# Patient Record
Sex: Female | Born: 1935
Health system: Southern US, Community
[De-identification: ages and names within clinical notes are randomized; demographics above are authoritative.]

---

## 2005-01-21 ENCOUNTER — Ambulatory Visit: Payer: Self-pay | Admitting: Family Medicine

## 2005-03-24 ENCOUNTER — Ambulatory Visit: Payer: Self-pay | Admitting: Family Medicine

## 2005-03-29 ENCOUNTER — Emergency Department: Payer: Self-pay | Admitting: Emergency Medicine

## 2005-07-19 ENCOUNTER — Ambulatory Visit: Payer: Self-pay | Admitting: Specialist

## 2005-07-28 ENCOUNTER — Ambulatory Visit: Payer: Self-pay | Admitting: Ophthalmology

## 2005-08-03 ENCOUNTER — Ambulatory Visit: Payer: Self-pay | Admitting: Ophthalmology

## 2007-09-08 ENCOUNTER — Ambulatory Visit: Payer: Self-pay | Admitting: Cardiology

## 2007-10-27 ENCOUNTER — Ambulatory Visit: Payer: Self-pay | Admitting: Family Medicine

## 2008-01-10 ENCOUNTER — Ambulatory Visit: Payer: Self-pay | Admitting: Ophthalmology

## 2008-01-17 ENCOUNTER — Ambulatory Visit: Payer: Self-pay | Admitting: Ophthalmology

## 2008-07-02 ENCOUNTER — Ambulatory Visit: Payer: Self-pay | Admitting: Gastroenterology

## 2008-07-17 ENCOUNTER — Ambulatory Visit: Payer: Self-pay | Admitting: *Deleted

## 2008-11-08 ENCOUNTER — Ambulatory Visit: Payer: Self-pay | Admitting: General Surgery

## 2008-11-15 ENCOUNTER — Ambulatory Visit: Payer: Self-pay | Admitting: General Surgery

## 2010-12-11 ENCOUNTER — Ambulatory Visit: Payer: Self-pay | Admitting: Specialist

## 2011-01-13 ENCOUNTER — Ambulatory Visit: Payer: Self-pay | Admitting: Pain Medicine

## 2011-06-15 ENCOUNTER — Ambulatory Visit: Payer: Self-pay | Admitting: Pain Medicine

## 2011-06-17 ENCOUNTER — Ambulatory Visit: Payer: Self-pay | Admitting: Pain Medicine

## 2011-07-07 ENCOUNTER — Ambulatory Visit: Payer: Self-pay | Admitting: Pain Medicine

## 2011-07-15 ENCOUNTER — Ambulatory Visit: Payer: Self-pay | Admitting: Pain Medicine

## 2013-07-02 ENCOUNTER — Ambulatory Visit: Payer: Self-pay | Admitting: Family Medicine

## 2013-08-21 ENCOUNTER — Emergency Department: Payer: Self-pay | Admitting: Emergency Medicine

## 2013-08-21 LAB — CBC WITH DIFFERENTIAL/PLATELET
Basophil #: 0 10*3/uL (ref 0.0–0.1)
Basophil %: 0.9 %
Eosinophil #: 0 10*3/uL (ref 0.0–0.7)
Eosinophil %: 0.9 %
HGB: 12.7 g/dL (ref 12.0–16.0)
Lymphocyte #: 1.7 10*3/uL (ref 1.0–3.6)
Lymphocyte %: 33.3 %
MCH: 33.7 pg (ref 26.0–34.0)
Monocyte #: 0.6 x10 3/mm (ref 0.2–0.9)
Monocyte %: 10.8 %
Neutrophil #: 2.8 10*3/uL (ref 1.4–6.5)
Neutrophil %: 54.1 %
RBC: 3.78 10*6/uL — ABNORMAL LOW (ref 3.80–5.20)
WBC: 5.2 10*3/uL (ref 3.6–11.0)

## 2013-08-21 LAB — COMPREHENSIVE METABOLIC PANEL
Albumin: 4.3 g/dL (ref 3.4–5.0)
Alkaline Phosphatase: 63 U/L (ref 50–136)
BUN: 20 mg/dL — ABNORMAL HIGH (ref 7–18)
Chloride: 90 mmol/L — ABNORMAL LOW (ref 98–107)
Co2: 33 mmol/L — ABNORMAL HIGH (ref 21–32)
Creatinine: 1.51 mg/dL — ABNORMAL HIGH (ref 0.60–1.30)
EGFR (African American): 35 — ABNORMAL LOW
Glucose: 107 mg/dL — ABNORMAL HIGH (ref 65–99)
Osmolality: 258 (ref 275–301)
Potassium: 4.6 mmol/L (ref 3.5–5.1)
SGOT(AST): 31 U/L (ref 15–37)
Total Protein: 7.7 g/dL (ref 6.4–8.2)

## 2013-08-21 LAB — LIPASE, BLOOD: Lipase: 317 U/L (ref 73–393)

## 2014-01-24 ENCOUNTER — Ambulatory Visit: Payer: Self-pay | Admitting: Family Medicine

## 2014-01-25 ENCOUNTER — Other Ambulatory Visit: Payer: Self-pay | Admitting: Family Medicine

## 2014-01-25 LAB — CBC WITH DIFFERENTIAL/PLATELET
BASOS ABS: 0 10*3/uL (ref 0.0–0.1)
Basophil %: 0.4 %
EOS PCT: 1 %
Eosinophil #: 0.1 10*3/uL (ref 0.0–0.7)
HCT: 25 % — ABNORMAL LOW (ref 35.0–47.0)
HGB: 8.1 g/dL — AB (ref 12.0–16.0)
Lymphocyte #: 0.9 10*3/uL — ABNORMAL LOW (ref 1.0–3.6)
Lymphocyte %: 12.1 %
MCH: 28.3 pg (ref 26.0–34.0)
MCHC: 32.5 g/dL (ref 32.0–36.0)
MCV: 87 fL (ref 80–100)
MONO ABS: 0.7 x10 3/mm (ref 0.2–0.9)
MONOS PCT: 8.9 %
Neutrophil #: 5.8 10*3/uL (ref 1.4–6.5)
Neutrophil %: 77.6 %
PLATELETS: 370 10*3/uL (ref 150–440)
RBC: 2.87 10*6/uL — ABNORMAL LOW (ref 3.80–5.20)
RDW: 15.9 % — ABNORMAL HIGH (ref 11.5–14.5)
WBC: 7.5 10*3/uL (ref 3.6–11.0)

## 2014-01-25 LAB — COMPREHENSIVE METABOLIC PANEL
ALBUMIN: 3.4 g/dL (ref 3.4–5.0)
ALT: 13 U/L (ref 12–78)
AST: 27 U/L (ref 15–37)
Alkaline Phosphatase: 94 U/L
Anion Gap: 4 — ABNORMAL LOW (ref 7–16)
BILIRUBIN TOTAL: 0.3 mg/dL (ref 0.2–1.0)
BUN: 33 mg/dL — AB (ref 7–18)
CALCIUM: 8.2 mg/dL — AB (ref 8.5–10.1)
CHLORIDE: 92 mmol/L — AB (ref 98–107)
CO2: 31 mmol/L (ref 21–32)
Creatinine: 1.92 mg/dL — ABNORMAL HIGH (ref 0.60–1.30)
EGFR (African American): 26 — ABNORMAL LOW
EGFR (Non-African Amer.): 22 — ABNORMAL LOW
Glucose: 103 mg/dL — ABNORMAL HIGH (ref 65–99)
Osmolality: 263 (ref 275–301)
Potassium: 4.7 mmol/L (ref 3.5–5.1)
SODIUM: 127 mmol/L — AB (ref 136–145)
Total Protein: 7.7 g/dL (ref 6.4–8.2)

## 2014-01-26 ENCOUNTER — Other Ambulatory Visit: Payer: Self-pay | Admitting: Family Medicine

## 2014-01-26 ENCOUNTER — Inpatient Hospital Stay: Payer: Self-pay | Admitting: Internal Medicine

## 2014-01-26 LAB — RENAL FUNCTION PANEL
ALBUMIN: 3.2 g/dL — AB (ref 3.4–5.0)
ANION GAP: 4 — AB (ref 7–16)
BUN: 29 mg/dL — ABNORMAL HIGH (ref 7–18)
CALCIUM: 8.3 mg/dL — AB (ref 8.5–10.1)
CHLORIDE: 94 mmol/L — AB (ref 98–107)
Co2: 30 mmol/L (ref 21–32)
Creatinine: 1.87 mg/dL — ABNORMAL HIGH (ref 0.60–1.30)
EGFR (African American): 27 — ABNORMAL LOW
GFR CALC NON AF AMER: 23 — AB
Glucose: 115 mg/dL — ABNORMAL HIGH (ref 65–99)
OSMOLALITY: 264 (ref 275–301)
Phosphorus: 3.8 mg/dL (ref 2.5–4.9)
Potassium: 4.2 mmol/L (ref 3.5–5.1)
Sodium: 128 mmol/L — ABNORMAL LOW (ref 136–145)

## 2014-01-26 LAB — COMPREHENSIVE METABOLIC PANEL
ALBUMIN: 3.6 g/dL (ref 3.4–5.0)
ALK PHOS: 97 U/L
ALT: 17 U/L (ref 12–78)
Anion Gap: 4 — ABNORMAL LOW (ref 7–16)
BILIRUBIN TOTAL: 0.3 mg/dL (ref 0.2–1.0)
BUN: 29 mg/dL — ABNORMAL HIGH (ref 7–18)
CALCIUM: 8.5 mg/dL (ref 8.5–10.1)
CREATININE: 1.77 mg/dL — AB (ref 0.60–1.30)
Chloride: 91 mmol/L — ABNORMAL LOW (ref 98–107)
Co2: 29 mmol/L (ref 21–32)
EGFR (African American): 29 — ABNORMAL LOW
GFR CALC NON AF AMER: 25 — AB
Glucose: 105 mg/dL — ABNORMAL HIGH (ref 65–99)
Osmolality: 256 (ref 275–301)
POTASSIUM: 4.2 mmol/L (ref 3.5–5.1)
SGOT(AST): 30 U/L (ref 15–37)
SODIUM: 124 mmol/L — AB (ref 136–145)
Total Protein: 8.1 g/dL (ref 6.4–8.2)

## 2014-01-26 LAB — CBC WITH DIFFERENTIAL/PLATELET
BASOS ABS: 0.1 10*3/uL (ref 0.0–0.1)
BASOS PCT: 0.5 %
BASOS PCT: 1.3 %
Basophil #: 0 10*3/uL (ref 0.0–0.1)
Eosinophil #: 0.1 10*3/uL (ref 0.0–0.7)
Eosinophil #: 0 10*3/uL (ref 0.0–0.7)
Eosinophil %: 1.1 %
Eosinophil %: 0.6 %
HCT: 24.9 % — AB (ref 35.0–47.0)
HCT: 24.3 % — ABNORMAL LOW (ref 35.0–47.0)
HGB: 8.2 g/dL — AB (ref 12.0–16.0)
HGB: 7.9 g/dL — ABNORMAL LOW (ref 12.0–16.0)
Lymphocyte #: 0.5 10*3/uL — ABNORMAL LOW (ref 1.0–3.6)
Lymphocyte #: 0.6 10*3/uL — ABNORMAL LOW (ref 1.0–3.6)
Lymphocyte %: 6.9 %
Lymphocyte %: 8 %
MCH: 28.5 pg (ref 26.0–34.0)
MCH: 28.1 pg (ref 26.0–34.0)
MCHC: 32.7 g/dL (ref 32.0–36.0)
MCHC: 32.4 g/dL (ref 32.0–36.0)
MCV: 87 fL (ref 80–100)
MCV: 87 fL (ref 80–100)
MONO ABS: 0.7 x10 3/mm (ref 0.2–0.9)
MONO ABS: 0.9 x10 3/mm (ref 0.2–0.9)
Monocyte %: 10.3 %
Monocyte %: 12 %
NEUTROS ABS: 6.1 10*3/uL (ref 1.4–6.5)
NEUTROS PCT: 81.2 %
Neutrophil #: 5.7 10*3/uL (ref 1.4–6.5)
Neutrophil %: 78.1 %
PLATELETS: 387 10*3/uL (ref 150–440)
Platelet: 396 10*3/uL (ref 150–440)
RBC: 2.87 10*6/uL — ABNORMAL LOW (ref 3.80–5.20)
RBC: 2.8 10*6/uL — AB (ref 3.80–5.20)
RDW: 15.8 % — ABNORMAL HIGH (ref 11.5–14.5)
RDW: 15.8 % — ABNORMAL HIGH (ref 11.5–14.5)
WBC: 7 10*3/uL (ref 3.6–11.0)
WBC: 7.8 10*3/uL (ref 3.6–11.0)

## 2014-01-26 LAB — URINALYSIS, COMPLETE
Bacteria: NONE SEEN
Bilirubin,UR: NEGATIVE
Blood: NEGATIVE
GLUCOSE, UR: NEGATIVE mg/dL (ref 0–75)
Hyaline Cast: 3
Ketone: NEGATIVE
Leukocyte Esterase: NEGATIVE
Nitrite: NEGATIVE
Ph: 6 (ref 4.5–8.0)
Protein: 100
RBC,UR: 1 /HPF (ref 0–5)
SPECIFIC GRAVITY: 1.019 (ref 1.003–1.030)
Squamous Epithelial: NONE SEEN
WBC UR: 1 /HPF (ref 0–5)

## 2014-01-26 LAB — TROPONIN I
TROPONIN-I: 0.06 ng/mL — AB
Troponin-I: 0.06 ng/mL — ABNORMAL HIGH

## 2014-01-26 LAB — PRO B NATRIURETIC PEPTIDE: B-TYPE NATIURETIC PEPTID: 2793 pg/mL — AB (ref 0–450)

## 2014-01-27 DIAGNOSIS — R7989 Other specified abnormal findings of blood chemistry: Secondary | ICD-10-CM

## 2014-01-27 DIAGNOSIS — I10 Essential (primary) hypertension: Secondary | ICD-10-CM

## 2014-01-27 DIAGNOSIS — I369 Nonrheumatic tricuspid valve disorder, unspecified: Secondary | ICD-10-CM

## 2014-01-27 DIAGNOSIS — J189 Pneumonia, unspecified organism: Secondary | ICD-10-CM

## 2014-01-27 LAB — BASIC METABOLIC PANEL
ANION GAP: 6 — AB (ref 7–16)
BUN: 26 mg/dL — ABNORMAL HIGH (ref 7–18)
CO2: 28 mmol/L (ref 21–32)
Calcium, Total: 7.9 mg/dL — ABNORMAL LOW (ref 8.5–10.1)
Chloride: 92 mmol/L — ABNORMAL LOW (ref 98–107)
Creatinine: 1.56 mg/dL — ABNORMAL HIGH (ref 0.60–1.30)
EGFR (African American): 33 — ABNORMAL LOW
EGFR (Non-African Amer.): 29 — ABNORMAL LOW
GLUCOSE: 105 mg/dL — AB (ref 65–99)
Osmolality: 258 (ref 275–301)
POTASSIUM: 4.2 mmol/L (ref 3.5–5.1)
Sodium: 126 mmol/L — ABNORMAL LOW (ref 136–145)

## 2014-01-27 LAB — CBC WITH DIFFERENTIAL/PLATELET
BASOS PCT: 0.5 %
Basophil #: 0 10*3/uL (ref 0.0–0.1)
EOS PCT: 1.1 %
Eosinophil #: 0.1 10*3/uL (ref 0.0–0.7)
HCT: 23 % — AB (ref 35.0–47.0)
HGB: 7.1 g/dL — ABNORMAL LOW (ref 12.0–16.0)
LYMPHS ABS: 0.7 10*3/uL — AB (ref 1.0–3.6)
Lymphocyte %: 9.3 %
MCH: 26.9 pg (ref 26.0–34.0)
MCHC: 31.1 g/dL — ABNORMAL LOW (ref 32.0–36.0)
MCV: 86 fL (ref 80–100)
Monocyte #: 0.6 x10 3/mm (ref 0.2–0.9)
Monocyte %: 8.1 %
NEUTROS PCT: 81 %
Neutrophil #: 5.9 10*3/uL (ref 1.4–6.5)
Platelet: 382 10*3/uL (ref 150–440)
RBC: 2.66 10*6/uL — ABNORMAL LOW (ref 3.80–5.20)
RDW: 16 % — ABNORMAL HIGH (ref 11.5–14.5)
WBC: 7.3 10*3/uL (ref 3.6–11.0)

## 2014-01-27 LAB — TSH: THYROID STIMULATING HORM: 6.67 u[IU]/mL — AB

## 2014-01-27 LAB — TROPONIN I: TROPONIN-I: 0.06 ng/mL — AB

## 2014-01-27 LAB — IRON AND TIBC
IRON SATURATION: 8 %
Iron Bind.Cap.(Total): 408 ug/dL (ref 250–450)
Iron: 34 ug/dL — ABNORMAL LOW (ref 50–170)
Unbound Iron-Bind.Cap.: 374 ug/dL

## 2014-01-28 LAB — BASIC METABOLIC PANEL
Anion Gap: 5 — ABNORMAL LOW (ref 7–16)
BUN: 22 mg/dL — ABNORMAL HIGH (ref 7–18)
CHLORIDE: 90 mmol/L — AB (ref 98–107)
Calcium, Total: 8.5 mg/dL (ref 8.5–10.1)
Co2: 28 mmol/L (ref 21–32)
Creatinine: 1.39 mg/dL — ABNORMAL HIGH (ref 0.60–1.30)
EGFR (African American): 38 — ABNORMAL LOW
EGFR (Non-African Amer.): 33 — ABNORMAL LOW
Glucose: 98 mg/dL (ref 65–99)
OSMOLALITY: 251 (ref 275–301)
POTASSIUM: 4.6 mmol/L (ref 3.5–5.1)
Sodium: 123 mmol/L — ABNORMAL LOW (ref 136–145)

## 2014-01-29 LAB — BASIC METABOLIC PANEL
ANION GAP: 4 — AB (ref 7–16)
BUN: 23 mg/dL — AB (ref 7–18)
CHLORIDE: 89 mmol/L — AB (ref 98–107)
CO2: 28 mmol/L (ref 21–32)
Calcium, Total: 8.4 mg/dL — ABNORMAL LOW (ref 8.5–10.1)
Creatinine: 1.44 mg/dL — ABNORMAL HIGH (ref 0.60–1.30)
EGFR (Non-African Amer.): 32 — ABNORMAL LOW
GFR CALC AF AMER: 37 — AB
Glucose: 145 mg/dL — ABNORMAL HIGH (ref 65–99)
Osmolality: 250 (ref 275–301)
Potassium: 4.1 mmol/L (ref 3.5–5.1)
Sodium: 121 mmol/L — ABNORMAL LOW (ref 136–145)

## 2014-01-29 LAB — CBC WITH DIFFERENTIAL/PLATELET
Basophil #: 0 10*3/uL (ref 0.0–0.1)
Basophil %: 0.5 %
EOS ABS: 0.1 10*3/uL (ref 0.0–0.7)
Eosinophil %: 0.8 %
HCT: 22.9 % — ABNORMAL LOW (ref 35.0–47.0)
HGB: 7.2 g/dL — ABNORMAL LOW (ref 12.0–16.0)
LYMPHS ABS: 0.5 10*3/uL — AB (ref 1.0–3.6)
LYMPHS PCT: 6.6 %
MCH: 27 pg (ref 26.0–34.0)
MCHC: 31.5 g/dL — AB (ref 32.0–36.0)
MCV: 86 fL (ref 80–100)
MONO ABS: 0.9 x10 3/mm (ref 0.2–0.9)
Monocyte %: 11.4 %
NEUTROS PCT: 80.7 %
Neutrophil #: 6.5 10*3/uL (ref 1.4–6.5)
Platelet: 397 10*3/uL (ref 150–440)
RBC: 2.67 10*6/uL — ABNORMAL LOW (ref 3.80–5.20)
RDW: 15.7 % — AB (ref 11.5–14.5)
WBC: 8.1 10*3/uL (ref 3.6–11.0)

## 2014-01-29 LAB — URIC ACID: Uric Acid: 4.8 mg/dL (ref 2.6–6.0)

## 2014-01-30 LAB — URINALYSIS, COMPLETE
BACTERIA: NONE SEEN
BILIRUBIN, UR: NEGATIVE
BLOOD: NEGATIVE
Glucose,UR: NEGATIVE mg/dL (ref 0–75)
Ketone: NEGATIVE
LEUKOCYTE ESTERASE: NEGATIVE
NITRITE: NEGATIVE
Ph: 5 (ref 4.5–8.0)
Protein: NEGATIVE
RBC,UR: <1 /HPF (ref 0–5)
SPECIFIC GRAVITY: 1.015 (ref 1.003–1.030)
Squamous Epithelial: <1

## 2014-01-30 LAB — HEMOGLOBIN: HGB: 6.6 g/dL — AB (ref 12.0–16.0)

## 2014-01-30 LAB — CREATININE, SERUM
Creatinine: 1.84 mg/dL — ABNORMAL HIGH (ref 0.60–1.30)
EGFR (African American): 27 — ABNORMAL LOW
EGFR (Non-African Amer.): 24 — ABNORMAL LOW

## 2014-01-30 LAB — FERRITIN: Ferritin (ARMC): 57 ng/mL (ref 8–388)

## 2014-01-30 LAB — OSMOLALITY, URINE: Osmolality: 369 mOsm/kg

## 2014-01-30 LAB — SODIUM: Sodium: 120 mmol/L — CL (ref 136–145)

## 2014-01-30 LAB — POTASSIUM, URINE RANDOM: Potassium, Urine Random: 33 mmol/L — ABNORMAL LOW (ref 55–125)

## 2014-01-30 LAB — CHLORIDE, URINE, RANDOM: CHLORIDE, URINE RANDOM: 48 mmol/L — AB (ref 55–125)

## 2014-01-30 LAB — PROTEIN / CREATININE RATIO, URINE
Creatinine, Urine: 53.2 mg/dL (ref 30.0–125.0)
Protein, Random Urine: 37 mg/dL — ABNORMAL HIGH (ref 0–12)
Protein/Creat. Ratio: 695 mg/gCREAT — ABNORMAL HIGH (ref 0–200)

## 2014-01-30 LAB — SODIUM, URINE, RANDOM: Sodium, Urine Random: 25 mmol/L (ref 20–110)

## 2014-01-31 LAB — CBC WITH DIFFERENTIAL/PLATELET
BASOS ABS: 0 10*3/uL (ref 0.0–0.1)
Basophil %: 0 %
EOS ABS: 0 10*3/uL (ref 0.0–0.7)
EOS PCT: 0 %
HCT: 23.7 % — ABNORMAL LOW (ref 35.0–47.0)
HGB: 8.1 g/dL — AB (ref 12.0–16.0)
LYMPHS ABS: 0.3 10*3/uL — AB (ref 1.0–3.6)
Lymphocyte %: 2.4 %
MCH: 28.9 pg (ref 26.0–34.0)
MCHC: 34.2 g/dL (ref 32.0–36.0)
MCV: 85 fL (ref 80–100)
MONO ABS: 0.7 x10 3/mm (ref 0.2–0.9)
Monocyte %: 6.2 %
NEUTROS ABS: 9.8 10*3/uL — AB (ref 1.4–6.5)
Neutrophil %: 91.4 %
Platelet: 415 10*3/uL (ref 150–440)
RBC: 2.8 10*6/uL — AB (ref 3.80–5.20)
RDW: 15.4 % — ABNORMAL HIGH (ref 11.5–14.5)
WBC: 10.7 10*3/uL (ref 3.6–11.0)

## 2014-01-31 LAB — BASIC METABOLIC PANEL
Anion Gap: 9 (ref 7–16)
BUN: 37 mg/dL — AB (ref 7–18)
CALCIUM: 8.8 mg/dL (ref 8.5–10.1)
CO2: 28 mmol/L (ref 21–32)
Chloride: 85 mmol/L — ABNORMAL LOW (ref 98–107)
Creatinine: 1.75 mg/dL — ABNORMAL HIGH (ref 0.60–1.30)
EGFR (African American): 29 — ABNORMAL LOW
EGFR (Non-African Amer.): 25 — ABNORMAL LOW
Glucose: 125 mg/dL — ABNORMAL HIGH (ref 65–99)
Osmolality: 256 (ref 275–301)
POTASSIUM: 4.3 mmol/L (ref 3.5–5.1)
Sodium: 122 mmol/L — ABNORMAL LOW (ref 136–145)

## 2014-02-01 LAB — BASIC METABOLIC PANEL
Anion Gap: 9 (ref 7–16)
BUN: 40 mg/dL — ABNORMAL HIGH (ref 7–18)
CALCIUM: 8.6 mg/dL (ref 8.5–10.1)
Chloride: 86 mmol/L — ABNORMAL LOW (ref 98–107)
Co2: 28 mmol/L (ref 21–32)
Creatinine: 1.85 mg/dL — ABNORMAL HIGH (ref 0.60–1.30)
EGFR (African American): 27 — ABNORMAL LOW
GFR CALC NON AF AMER: 23 — AB
GLUCOSE: 114 mg/dL — AB (ref 65–99)
OSMOLALITY: 258 (ref 275–301)
Potassium: 4.2 mmol/L (ref 3.5–5.1)
Sodium: 123 mmol/L — ABNORMAL LOW (ref 136–145)

## 2014-02-01 LAB — CBC WITH DIFFERENTIAL/PLATELET
Basophil #: 0.1 10*3/uL (ref 0.0–0.1)
Basophil %: 0.5 %
Eosinophil #: 0 10*3/uL (ref 0.0–0.7)
Eosinophil %: 0.1 %
HCT: 25.1 % — ABNORMAL LOW (ref 35.0–47.0)
HGB: 8.4 g/dL — ABNORMAL LOW (ref 12.0–16.0)
LYMPHS ABS: 0.3 10*3/uL — AB (ref 1.0–3.6)
Lymphocyte %: 2.9 %
MCH: 28.7 pg (ref 26.0–34.0)
MCHC: 33.4 g/dL (ref 32.0–36.0)
MCV: 86 fL (ref 80–100)
Monocyte #: 0.9 x10 3/mm (ref 0.2–0.9)
Monocyte %: 7.8 %
Neutrophil #: 10 10*3/uL — ABNORMAL HIGH (ref 1.4–6.5)
Neutrophil %: 88.7 %
Platelet: 459 10*3/uL — ABNORMAL HIGH (ref 150–440)
RBC: 2.92 10*6/uL — ABNORMAL LOW (ref 3.80–5.20)
RDW: 15.7 % — AB (ref 11.5–14.5)
WBC: 11.2 10*3/uL — ABNORMAL HIGH (ref 3.6–11.0)

## 2014-02-02 LAB — BASIC METABOLIC PANEL
Anion Gap: 5 — ABNORMAL LOW (ref 7–16)
BUN: 39 mg/dL — ABNORMAL HIGH (ref 7–18)
CALCIUM: 9 mg/dL (ref 8.5–10.1)
CO2: 33 mmol/L — AB (ref 21–32)
Chloride: 88 mmol/L — ABNORMAL LOW (ref 98–107)
Creatinine: 1.84 mg/dL — ABNORMAL HIGH (ref 0.60–1.30)
EGFR (African American): 27 — ABNORMAL LOW
EGFR (Non-African Amer.): 24 — ABNORMAL LOW
GLUCOSE: 107 mg/dL — AB (ref 65–99)
Osmolality: 263 (ref 275–301)
POTASSIUM: 4.2 mmol/L (ref 3.5–5.1)
Sodium: 126 mmol/L — ABNORMAL LOW (ref 136–145)

## 2014-02-02 LAB — PROTEIN / CREATININE RATIO, URINE
Creatinine, Urine: 83.2 mg/dL (ref 30.0–125.0)
PROTEIN, RANDOM URINE: 74 mg/dL — AB (ref 0–12)
Protein/Creat. Ratio: 889 mg/gCREAT — ABNORMAL HIGH (ref 0–200)

## 2014-02-02 LAB — OCCULT BLOOD X 1 CARD TO LAB, STOOL: OCCULT BLOOD, FECES: POSITIVE

## 2014-02-02 LAB — HEMOGLOBIN: HGB: 8.9 g/dL — ABNORMAL LOW (ref 12.0–16.0)

## 2014-02-03 ENCOUNTER — Ambulatory Visit: Payer: Self-pay | Admitting: Internal Medicine

## 2014-02-03 LAB — BASIC METABOLIC PANEL
ANION GAP: 5 — AB (ref 7–16)
BUN: 36 mg/dL — ABNORMAL HIGH (ref 7–18)
CO2: 34 mmol/L — AB (ref 21–32)
Calcium, Total: 8.7 mg/dL (ref 8.5–10.1)
Chloride: 87 mmol/L — ABNORMAL LOW (ref 98–107)
Creatinine: 1.56 mg/dL — ABNORMAL HIGH (ref 0.60–1.30)
EGFR (African American): 33 — ABNORMAL LOW
EGFR (Non-African Amer.): 29 — ABNORMAL LOW
GLUCOSE: 112 mg/dL — AB (ref 65–99)
Osmolality: 262 (ref 275–301)
Potassium: 4.6 mmol/L (ref 3.5–5.1)
Sodium: 126 mmol/L — ABNORMAL LOW (ref 136–145)

## 2014-02-04 LAB — BASIC METABOLIC PANEL
Anion Gap: 5 — ABNORMAL LOW (ref 7–16)
BUN: 38 mg/dL — AB (ref 7–18)
Calcium, Total: 8.8 mg/dL (ref 8.5–10.1)
Chloride: 87 mmol/L — ABNORMAL LOW (ref 98–107)
Co2: 32 mmol/L (ref 21–32)
Creatinine: 1.6 mg/dL — ABNORMAL HIGH (ref 0.60–1.30)
EGFR (African American): 32 — ABNORMAL LOW
GFR CALC NON AF AMER: 28 — AB
GLUCOSE: 119 mg/dL — AB (ref 65–99)
Osmolality: 260 (ref 275–301)
POTASSIUM: 4.8 mmol/L (ref 3.5–5.1)
Sodium: 124 mmol/L — ABNORMAL LOW (ref 136–145)

## 2014-02-04 LAB — SODIUM
SODIUM: 123 mmol/L — AB (ref 136–145)
Sodium: 124 mmol/L — ABNORMAL LOW (ref 136–145)
Sodium: 124 mmol/L — ABNORMAL LOW (ref 136–145)
Sodium: 125 mmol/L — ABNORMAL LOW (ref 136–145)

## 2014-02-04 LAB — PROTEIN ELECTROPHORESIS(ARMC)

## 2014-02-05 LAB — SODIUM
SODIUM: 127 mmol/L — AB (ref 136–145)
SODIUM: 129 mmol/L — AB (ref 136–145)
SODIUM: 125 mmol/L — AB (ref 136–145)
Sodium: 127 mmol/L — ABNORMAL LOW (ref 136–145)
Sodium: 125 mmol/L — ABNORMAL LOW (ref 136–145)
Sodium: 128 mmol/L — ABNORMAL LOW (ref 136–145)

## 2014-02-05 LAB — CBC WITH DIFFERENTIAL/PLATELET
BASOS ABS: 0 10*3/uL (ref 0.0–0.1)
BASOS PCT: 0.3 %
EOS PCT: 0 %
Eosinophil #: 0 10*3/uL (ref 0.0–0.7)
HCT: 25.1 % — AB (ref 35.0–47.0)
HGB: 8.1 g/dL — AB (ref 12.0–16.0)
Lymphocyte #: 0.2 10*3/uL — ABNORMAL LOW (ref 1.0–3.6)
Lymphocyte %: 1.5 %
MCH: 28.2 pg (ref 26.0–34.0)
MCHC: 32.4 g/dL (ref 32.0–36.0)
MCV: 87 fL (ref 80–100)
Monocyte #: 0.2 x10 3/mm (ref 0.2–0.9)
Monocyte %: 1.3 %
Neutrophil #: 12.1 10*3/uL — ABNORMAL HIGH (ref 1.4–6.5)
Neutrophil %: 96.9 %
PLATELETS: 369 10*3/uL (ref 150–440)
RBC: 2.89 10*6/uL — AB (ref 3.80–5.20)
RDW: 15.7 % — ABNORMAL HIGH (ref 11.5–14.5)
WBC: 12.5 10*3/uL — ABNORMAL HIGH (ref 3.6–11.0)

## 2014-02-05 LAB — UR PROT ELECTROPHORESIS, URINE RANDOM

## 2014-02-06 LAB — BASIC METABOLIC PANEL
Anion Gap: 1 — ABNORMAL LOW (ref 7–16)
BUN: 39 mg/dL — ABNORMAL HIGH (ref 7–18)
CO2: 35 mmol/L — AB (ref 21–32)
CREATININE: 1.9 mg/dL — AB (ref 0.60–1.30)
Calcium, Total: 8.6 mg/dL (ref 8.5–10.1)
Chloride: 92 mmol/L — ABNORMAL LOW (ref 98–107)
EGFR (African American): 26 — ABNORMAL LOW
EGFR (Non-African Amer.): 23 — ABNORMAL LOW
Glucose: 121 mg/dL — ABNORMAL HIGH (ref 65–99)
OSMOLALITY: 268 (ref 275–301)
Potassium: 4.7 mmol/L (ref 3.5–5.1)
SODIUM: 128 mmol/L — AB (ref 136–145)

## 2014-02-06 LAB — CBC WITH DIFFERENTIAL/PLATELET
Basophil #: 0 10*3/uL (ref 0.0–0.1)
Basophil %: 0.2 %
EOS ABS: 0 10*3/uL (ref 0.0–0.7)
Eosinophil %: 0 %
HCT: 23.8 % — AB (ref 35.0–47.0)
HGB: 7.8 g/dL — ABNORMAL LOW (ref 12.0–16.0)
LYMPHS PCT: 2.2 %
Lymphocyte #: 0.2 10*3/uL — ABNORMAL LOW (ref 1.0–3.6)
MCH: 28.3 pg (ref 26.0–34.0)
MCHC: 32.6 g/dL (ref 32.0–36.0)
MCV: 87 fL (ref 80–100)
MONO ABS: 0.1 x10 3/mm — AB (ref 0.2–0.9)
MONOS PCT: 1.4 %
Neutrophil #: 10.1 10*3/uL — ABNORMAL HIGH (ref 1.4–6.5)
Neutrophil %: 96.2 %
Platelet: 275 10*3/uL (ref 150–440)
RBC: 2.75 10*6/uL — AB (ref 3.80–5.20)
RDW: 15.9 % — AB (ref 11.5–14.5)
WBC: 10.4 10*3/uL (ref 3.6–11.0)

## 2014-02-06 LAB — SODIUM
SODIUM: 129 mmol/L — AB (ref 136–145)
Sodium: 129 mmol/L — ABNORMAL LOW (ref 136–145)
Sodium: 129 mmol/L — ABNORMAL LOW (ref 136–145)
Sodium: 128 mmol/L — ABNORMAL LOW (ref 136–145)
Sodium: 130 mmol/L — ABNORMAL LOW (ref 136–145)

## 2014-02-07 LAB — EXPECTORATED SPUTUM ASSESSMENT W GRAM STAIN, RFLX TO RESP C

## 2014-03-06 ENCOUNTER — Ambulatory Visit: Payer: Self-pay | Admitting: Internal Medicine

## 2014-03-06 DEATH — deceased

## 2015-03-29 NOTE — Consult Note (Signed)
PATIENT NAME:  Joy Gonzalez, [name] MR#:  [id number] DATE OF BIRTH:  1920/08/25  DATE OF ADMISSION: 01/26/2014  DATE OF CONSULTATION:  02/03/2014  CONSULTING PHYSICIAN:  Ezzard StandingPaul Y. Orell Hurtado, MD  REASON FOR REFERRAL: Heme-positive stool and anemia.   HISTORY OF PRESENT ILLNESS: The patient is a 81 year old white female who initially got admitted on February 21st in respiratory failure. She was complaining of coughing and shortness of breath. She also had hemoptysis.  she had gotten po antibiotics without relief. The patient was noted to be anemic and hyponatremic.   REASON FOR CONSULTATION: The patient continued to be anemic and the stools heme positive and this is why I was asked to see the patient.   The patient has no GI symptoms whatsoever. She used to have dysphagia for which she underwent EGD with dilation, 2009, for GE junction stricture. She has not had any dysphagia since. She recalls having a normal colonoscopy years ago. Otherwise, she has no symptoms of nausea, vomiting, heartburn, or indigestion. She has some vague low abdominal pain, probably from coughing so much. She recalls coughing up some blood when she initially came in, but she denies any gross hematochezia or melena. She also denies any diarrhea or constipation.   PAST MEDICAL HISTORY: Includes hypothyroidism, history of reflux and hypertension. She also has hyperlipidemia.   PAST SURGICAL HISTORY: She has had thyroidectomy and hysterectomy in the past.   MEDICATIONS AT HOME: Include Synthroid daily, Lopressor 25 mg daily, Zocor 20 mg daily, baby aspirin daily.   FAMILY HISTORY: Notable for coronary artery disease.   REVIEW OF SYSTEMS:  CONSTITUTIONAL: Fever initially but no fever now. There are no weight changes, no visual or hearing changes. No chest pain or palpitations. She had coughing and shortness of breath as well as hemoptysis.  GASTROINTESTINAL: Symptoms have been described already.   PHYSICAL EXAMINATION:  GENERAL: An  elderly female in no acute distress.  VITAL SIGNS: Afebrile. Vital signs are stable.  HEAD AND NECK: Within normal limits.  CARDIAC: Regular rhythm and rate.  LUNGS: Still shows a few rhonchi.  ABDOMEN: Soft and nontender. There is some mild tenderness in the lower abdomen with deep palpation. There is no hepatomegaly. She has active bowel sounds.  EXTREMITIES: No clubbing, cyanosis, or edema.  NEUROLOGICAL: Negative.  SKIN: Examination is normal.   LABORATORIES: March 1: Sodium 126, creatinine 1.56, BUN. 36. Most recent hemoglobin was 8.9.   Stool was heme positive.   Percent saturation for iron was low but ferritin level was normal at 57.   ASSESSMENT AND PLAN: This is a patient with significant anemia on presentation. The patient has essentially no GI symptoms although the stool was heme positive, but she admitted to having mostly respiratory symptoms of coughing and hemoptysis. I suspect she swallowed some blood that eventually came out as blood in the stool. I am not convinced that there is any internal gastrointestinal bleeding at this time.   The patient is not in any shape to undergo colonoscopy right now. The patient is not interested in having a colonoscopy at her age either. However, if the patient is actively bleeding, I will consider colonoscopy if respiratory status becomes stable.   Thank you for the referral.   ____________________________ Ezzard StandingPaul Y. Bluford Kaufmannh, MD pyo:np D: 02/04/2014 16:58:54 ET T: 02/04/2014 17:11:48 ET JOB#: [id number]  cc: Ezzard StandingPaul Y. Bluford Kaufmannh, MD, <Dictator> Ezzard StandingPAUL Y Julanne Schlueter MD ELECTRONICALLY SIGNED 02/04/2014 19:11

## 2015-03-29 NOTE — Consult Note (Signed)
Asked to see patient for anemia and heme positive stool. Full consult to follow. Admitted with resp failure. SOB, coughing, and hemoptysis. No GI complaints except abd sore from coughing so much. Had EGD with dilation in 2009 for GE stricture. No dysphagia since. Recalls having normal colonoscopy years ago. % sat low for Fe but ferritin normal. Heme positive stool could very well be from swallowed blood from coughing. No convincing evidence that patient bleeding from GI tract. Pt not in shape to undergo any colonoscopy now. Will consider colonoscpy only if patient actively bleeding and resp status stable. Thanks.  Electronic Signatures: Lutricia Feilh, Dezire Turk (MD)  (Signed on 01-Mar-15 12:23)  Authored  Last Updated: 01-Mar-15 12:23 by Lutricia Feilh, Leeana Creer (MD)

## 2015-03-29 NOTE — H&P (Signed)
PATIENT NAME:  Joy Gonzalez, Joy Gonzalez MR#:  [id number] DATE OF BIRTH:  08/23/1920  DATE OF ADMISSION:  01/26/2014  REFERRING PHYSICIAN: Dr. Silverio Lay in the Emergency Room.   FAMILY PHYSICIAN: Dr. Elease Hashimoto.   REASON FOR ADMISSION: Pneumonia.   HISTORY OF PRESENT ILLNESS: The patient is a 81 year old female who lives independently in [place] with a history of reflux, hypothyroidism, benign hypertension, who presents to the Emergency Room with pneumonia, which is worsening despite outpatient treatment. The patient was placed on oral antibiotics approximately 2 to 3 days ago for pneumonia. She presents with worsening shortness of breath, cough, and fatigue. In the Emergency Room, the patient was noted to be profoundly anemic and hyponatremic. She was also found to have an elevated troponin. She denies any history of anemia. Her renal function is poor in the Emergency Room consistent with dehydration. She is now admitted for further evaluation.   PAST MEDICAL HISTORY: 1.  Hypothyroidism.  2.  GE reflux disease.  3.  Benign hypertension.  4.  Hyperlipidemia.  5.  Status post thyroidectomy.  6.  Status post hysterectomy.   MEDICATIONS: 1.  Zocor 20 mg p.o. daily.  2.  Lopressor 25 mg p.o. daily.  3.  Synthroid 88 mcg p.o. daily. 4.  Calcium 1 p.o. b.i.d. 5.  Aspirin 81 mg p.o. daily.   ALLERGIES: CODEINE.   SOCIAL HISTORY: Negative for alcohol or tobacco abuse.   FAMILY HISTORY: Positive coronary artery disease and hypertension.   REVIEW OF SYSTEMS:    CONSTITUTIONAL: The patient has had fever, but no change in weight.  EYES: No blurred or double vision. No glaucoma.  EARS, NOSE, THROAT: No tinnitus or hearing loss. No nasal discharge or bleeding. No difficulty swallowing.  RESPIRATORY: The patient has had cough and wheezing. Denies hemoptysis. No painful respiration.  CARDIOVASCULAR: No chest pain or orthopnea. No palpitations or syncope.  GASTROINTESTINAL: Some nausea, but no vomiting or  diarrhea. No change in bowel habits or abdominal pain.  GENITOURINARY: No dysuria or hematuria. No incontinence.  ENDOCRINE: No polyuria or polydipsia. No heat or cold intolerance.  HEMATOLOGIC: The patient denies anemia, easy bruising or bleeding.  LYMPHATIC: No swollen glands.  MUSCULOSKELETAL: The patient denies pain in her neck, back, shoulders, knees, or hips. No gout.  NEUROLOGIC: No numbness or migraines. Denies stroke or seizures.  PSYCHIATRIC: The patient denies anxiety, insomnia, or depression.   PHYSICAL EXAMINATION: GENERAL: The patient is elderly, chronically ill appearing, in no acute distress.  VITAL SIGNS: Currently remarkable for a blood pressure of 172/81, heart rate of 74, respiratory rate of 15, temperature of 97.6 with a sat of 98% on 2 liters.  HEENT: Normocephalic, atraumatic. Pupils equally round and reactive to light and accommodation. Extraocular movements are intact. Sclerae are anicteric. Conjunctivae are clear. Oropharynx is dry but clear.  NECK: Supple without JVD. No adenopathy or thyromegaly is noted.  LUNGS: Reveal basilar rhonchi with no wheezes or rales. No dullness. Respiratory effort is mildly increased.  CARDIAC: Regular rate and rhythm with normal S1 and S2. No significant rubs, murmurs, or gallops. PMI is nondisplaced. Chest wall is nontender.  ABDOMEN: Soft, nontender, with normoactive bowel sounds. No organomegaly or masses were appreciated. No hernias or bruits were noted.  EXTREMITIES: Without clubbing, cyanosis, or edema. Pulses were 2+ bilaterally.  SKIN: Warm and dry without rash or lesions.  NEUROLOGIC: Cranial nerves II through XII grossly intact. Deep tendon reflexes were symmetric. Motor and sensory exams nonfocal.  PSYCHIATRIC: Revealed a patient who was  alert and oriented to person, place, and time. She was cooperative and used good judgment.   LABORATORY, DIAGNOSTIC, AND RADIOLOGICAL DATA: EKG revealed sinus rhythm with an old anteroseptal  MI. Chest x-ray revealed bilateral hilar pneumonia. White count was 7.8 with a hemoglobin of 7.9. Troponin was 0.06. Glucose 105 with a BUN of 29, creatinine 1.77 with a GFR of 25. Sodium was 124 with a potassium of 4.2.   ASSESSMENT: 1.  Bilateral pneumonia.  2.  Hyponatremia.  3.  Dehydration.  4.  Acute renal failure.  5.  Elevated troponin.  6.  Normocytic anemia of unclear etiology.   PLAN: The patient will be admitted to the floor as a full code. She will be started on IV fluids with IV antibiotics and DuoNeb SVNs. We will follow her anemia closely and transfuse as needed. Will check her thyroid status. Because of her elevated troponin, will follow serial cardiac enzymes and obtain an echocardiogram and a cardiology consult. Will consult physical therapy for ambulation. Will send off anemia labs and guaiac all stools. Follow up routine labs in the morning. Further treatment and evaluation will depend upon the patient's progress.   TOTAL TIME SPENT ON THIS PATIENT: 50 minutes.    ____________________________ Joy LopeJeffrey D. Judithann SheenSparks, MD jds:jcm D: 01/26/2014 18:16:53 ET T: 01/26/2014 19:52:18 ET JOB#: [id number]  cc: Joy LopeJeffrey D. Judithann SheenSparks, MD, <Dictator> Leo GrosserNancy J. Maloney, MD Alaija Ruble Rodena Medin Edmond Ginsberg MD ELECTRONICALLY SIGNED 01/27/2014 11:14

## 2015-03-29 NOTE — Discharge Summary (Signed)
PATIENT NAME:  Joy Gonzalez, Joy L MR#:  [id number] DATE OF BIRTH:  [birth year]  DATE OF ADMISSION:  01/26/2014 DATE OF DISCHARGE:  02/08/2014  DISPOSITION: Hospice.  DISCHARGE DIAGNOSES: 1.  Acute respiratory failure due to multilobar pneumonia.  2.  Severe hyponatremia.  3.  Anemia.  4.  Hypothyroidism.  5.  Hypertension. 6.  Hyperlipidemia. 7.  Constipation. 8.  Generalized weakness. 9.  Poor p.o. intake.   PERTINENT LABS AND EVALUATIONS: Glucose 115, BUN 29, creatinine 1.87, sodium 128, potassium 4.2, chloride 94, CO2 30, calcium 8.3. Phosphorus 3.8. WBC 7.   EKG showed normal sinus rhythm with anterior septal infarct.   BNP 2793. Troponin borderline at 0.06.  Chest x-ray showed moderate-sized hiatal hernia. Increased bilateral perihilar infiltrates consistent with pneumonia.   Troponin was 0.06.   Echocardiogram with an EF of 50% to 55%, dilated right and left atrium, mild mitral regurg, moderate tricuspid regurg, severe elevated pulmonary artery systolic pressure.   CT of the chest without contrast showed multilobar pneumonia.   CONSULTANTS DURING HOSPITALIZATION: Included Dr. Thedore MinsSingh, Dr. Cherylann RatelLateef, Dr. Bluford Kaufmannh.  HOSPITAL COURSE: The patient is a 81 year old white female who was admitted with shortness of breath, cough.  Found to have pneumonia. Started on Rocephin and Zithromax. Also was treated with IV fluids on presentation. During the hospitalization, the patient's respiratory status continued to not improve and was requiring Ventimask. She was DNR. She was treated with IV antibiotics, subsequently changed to Zosyn and Zyvox was added. She also was noted to have severe hyponatremia, acute renal failure, and was followed by nephrology during this timeframe. Due to the patient's failure to improve, a palliative care consult was obtained. She was already a DNR, and then the patient was arranged to be discharged to home hospice. At this time, the patient is doing much better and is stable  to be discharged to hospice with goal of comfort care.   DISCHARGE MEDICATIONS: Morphine 20 mg/mL 0.5 every 4 hours, Ativan 0.5 one tab 4 times a day as needed.   CODE STATUS: DNR.  DISCHARGE ACTIVITY: As tolerated.   DISCHARGE INSTRUCTIONS: Medications may be crushed when appropriate. O2 continuous and p.r.n.   TIME SPENT ON DISCHARGE: 35 minutes. ____________________________ Lacie ScottsShreyang H. Allena KatzPatel, MD shp:sb D: 02/08/2014 08:14:00 ET T: 02/08/2014 08:37:11 ET JOB#: [id number]  cc: Allen Basista H. Allena KatzPatel, MD, <Dictator> Charise CarwinSHREYANG H Aundray Cartlidge MD ELECTRONICALLY SIGNED 02/12/2014 15:29

## 2015-03-29 NOTE — Consult Note (Signed)
General Aspect 81 year old female  with a history of reflux, hypothyroidism, benign hypertension, thyroid disease, who presents to the Emergency Room with cough, SOB, CXR suggesting pneumonia. Cardiology was consulted for elevated cardiac enz.   The patient was placed on oral antibiotics approximately 2 to 3 days ago for pneumonia. She presents with worsening shortness of breath, cough, and fatigue.He has had malaise for some time. Family reports anorexia for weeks. She denies any prior cardiac issues.    In the Emergency Room, the patient was noted to be profoundly anemic and hyponatremic. Initial troponin of 0.06.   Her renal function on arrival consistent with dehydration.     PAST MEDICAL HISTORY: 1.  Hypothyroidism.  2.  GE reflux disease.  3.  Benign hypertension.  4.  Hyperlipidemia.  5.  Status post thyroidectomy.  6.  Status post hysterectomy.   MEDICATIONS: 1.  Zocor 20 mg p.o. daily.  2.  Lopressor 25 mg p.o. daily.  3.  Synthroid 88 mcg p.o. daily. 4.  Calcium 1 p.o. b.i.d. 5.  Aspirin 81 mg p.o. daily.   ALLERGIES: CODEINE.   SOCIAL HISTORY: Negative for alcohol or tobacco abuse.   FAMILY HISTORY: Positive coronary artery disease and hypertension.   Physical Exam:  GEN well developed, well nourished, no acute distress, pale, tired   HEENT pale conjunctivae, hearing intact to voice, moist oral mucosa   NECK supple   RESP normal resp effort  wheezing  rhonchi   CARD Regular rate and rhythm  No murmur   ABD denies tenderness  soft   LYMPH negative axillae   EXTR negative edema   SKIN normal to palpation   NEURO motor/sensory function intact   PSYCH alert, A+O to time, place, person, good insight   Review of Systems:  Subjective/Chief Complaint weak, cough, SOB   General: Fatigue  Weakness   Skin: No Complaints   ENT: No Complaints   Eyes: No Complaints   Neck: No Complaints   Respiratory: Frequent cough  Short of breath  Wheezing    Cardiovascular: Dyspnea   Gastrointestinal: No Complaints   Genitourinary: No Complaints   Vascular: No Complaints   Musculoskeletal: No Complaints   Neurologic: No Complaints   Hematologic: No Complaints   Endocrine: No Complaints   Psychiatric: No Complaints   Review of Systems: All other systems were reviewed and found to be negative   Medications/Allergies Reviewed Medications/Allergies reviewed   Lab Results:  Thyroid:  22-Feb-15 00:32   Thyroid Stimulating Hormone  6.67 (0.45-4.50 (International Unit)  ----------------------- Pregnant patients have  different reference  ranges for TSH:  - - - - - - - - - -  Pregnant, first trimetser:  0.36 - 2.50 uIU/mL)  Routine BB:  22-Feb-15 00:32   ABO Group + Rh Type O Negative  Antibody Screen NEGATIVE (Result(s) reported on 27 Jan 2014 at 02:13AM.)  Routine Chem:  22-Feb-15 00:32   Result Comment TROPONIN - RESULTS VERIFIED BY REPEAT TESTING.  - HIGH PREVIOUSLY CALLED BY [facility] AT 1729 ON  - 01-26-14/RWW  Result(s) reported on 27 Jan 2014 at 01:53AM.  Iron Binding Capacity (TIBC) 408  Unbound Iron Binding Capacity 374  Iron, Serum  34  Iron Saturation 8 (Result(s) reported on 27 Jan 2014 at 01:42AM.)  Glucose, Serum  105  BUN  26  Creatinine (comp)  1.56  Sodium, Serum  126  Potassium, Serum 4.2  Chloride, Serum  92  CO2, Serum 28  Calcium (Total), Serum  7.9  Anion Gap  6  Osmolality (calc) 258  eGFR (African American)  33  eGFR (Non-African American)  29 (eGFR values <38m/min/1.73 m2 may be an indication of chronic kidney disease (CKD). Calculated eGFR is useful in patients with stable renal function. The eGFR calculation will not be reliable in acutely ill patients when serum creatinine is changing rapidly. It is not useful in  patients on dialysis. The eGFR calculation may not be applicable to patients at the low and high extremes of body sizes, pregnant women, and vegetarians.)  Cardiac:  22-Feb-15  00:32   Troponin I  0.06 (0.00-0.05 0.05 ng/mL or less: NEGATIVE  Repeat testing in 3-6 hrs  if clinically indicated. >0.05 ng/mL: POTENTIAL  MYOCARDIAL INJURY. Repeat  testing in 3-6 hrs if  clinically indicated. NOTE: An increase or decrease  of 30% or more on serial  testing suggests a  clinically important change)  Routine Hem:  22-Feb-15 00:32   WBC (CBC) 7.3  RBC (CBC)  2.66  Hemoglobin (CBC)  7.1  Hematocrit (CBC)  23.0  Platelet Count (CBC) 382  MCV 86  MCH 26.9  MCHC  31.1  RDW  16.0  Neutrophil % 81.0  Lymphocyte % 9.3  Monocyte % 8.1  Eosinophil % 1.1  Basophil % 0.5  Neutrophil # 5.9  Lymphocyte #  0.7  Monocyte # 0.6  Eosinophil # 0.1  Basophil # 0.0 (Result(s) reported on 27 Jan 2014 at 01:09AM.)   EKG:  Interpretation EKG shows NSR with rate 72 bpm, consider old anterior MI   Radiology Results: XRay:    21-Feb-15 16:49, Chest PA and Lateral  Chest PA and Lateral   REASON FOR EXAM:    SOB  COMMENTS:       PROCEDURE: DXR - DXR CHEST PA (OR AP) AND LATERAL  - Jan 26 2014  4:49PM     CLINICAL DATA:  Cough, difficulty breathing, shortness of breath    EXAM:  CHEST  2 VIEW    COMPARISON:  01/24/2014    FINDINGS:  Upper normal heart size.  Calcified tortuous aorta.    Moderate-sized hiatal hernia.    Patchy perihilar infiltrates increased since previous exam.    Underlying COPD.    No gross pleural effusion or pneumothorax.    Bones demineralized.     IMPRESSION:  Moderate-sized hiatal hernia.  Increased bilateral perihilar infiltrates consistent with pneumonia.      Electronically Signed    By: MLavonia DanaM.D.    On: 01/26/2014 17:03         Verified By: MBurnetta Sabin M.D.,    Codeine: Unknown  Vital Signs/Nurse's Notes: **Vital Signs.:   22-Feb-15 08:44  Vital Signs Type Q 4hr  Temperature Temperature (F) 98  Celsius 36.6  Temperature Source oral  Pulse Pulse 83  Respirations Respirations 18  Systolic BP Systolic  BP 1696 Diastolic BP (mmHg) Diastolic BP (mmHg) 64  Mean BP 98  Pulse Ox % Pulse Ox % 96  Pulse Ox Activity Level  At rest  Oxygen Delivery 2L; Nasal Cannula    Impression 81year old female  with a history of reflux, hypothyroidism, benign hypertension, thyroid disease, who presents to the Emergency Room with cough, SOB, CXR suggesting pneumonia. Cardiology was consulted for elevated cardiac enz.  1) Elevated cardiac enz: denies chest pain, only malaise from PNA and anemia no significant change in troponin 0.6 x 3 EKG concerning for old anterior MI Will evaluate echo for wall motion abn --if  there is wall motion abn, could conisder stress test at a later date. She is not having active ischemia minimal troponin elevation from anemia, PNA  2) PNA: cxr suggest b/l perihilar on ABX  3) Anemia: anorexia over the past few weeks (months?) May need full iron studies, guiac  4) Renal dysfunction: appears to be from dehydration: improving with gentle fluids   Plan . 5) hypothyroid: on suopplemental thyroid medication management per medical team  6) HTN: would continue outpt meds, adjust amlodipine upwards to 10 g if needed for SBP >150 adjust   Electronic Signatures: Ida Rogue (MD)  (Signed 22-Feb-15 14:11)  Authored: General Aspect/Present Illness, History and Physical Exam, Review of System, Past Medical History, Home Medications, Labs, EKG , Radiology, Allergies, Vital Signs/Nurse's Notes, Impression/Plan   Last Updated: 22-Feb-15 14:11 by Ida Rogue (MD)

## 2015-10-09 IMAGING — CR DG CHEST 2V
1 series · 2 of 2 positions shown · non-contrast
Comparison: 01/24/2014

CLINICAL DATA: Cough, difficulty breathing, shortness of breath

EXAM:
CHEST  2 VIEW

[Series 1: pa · 0.17mm/px · 2 of 2 slices shown]
[im 1/2]
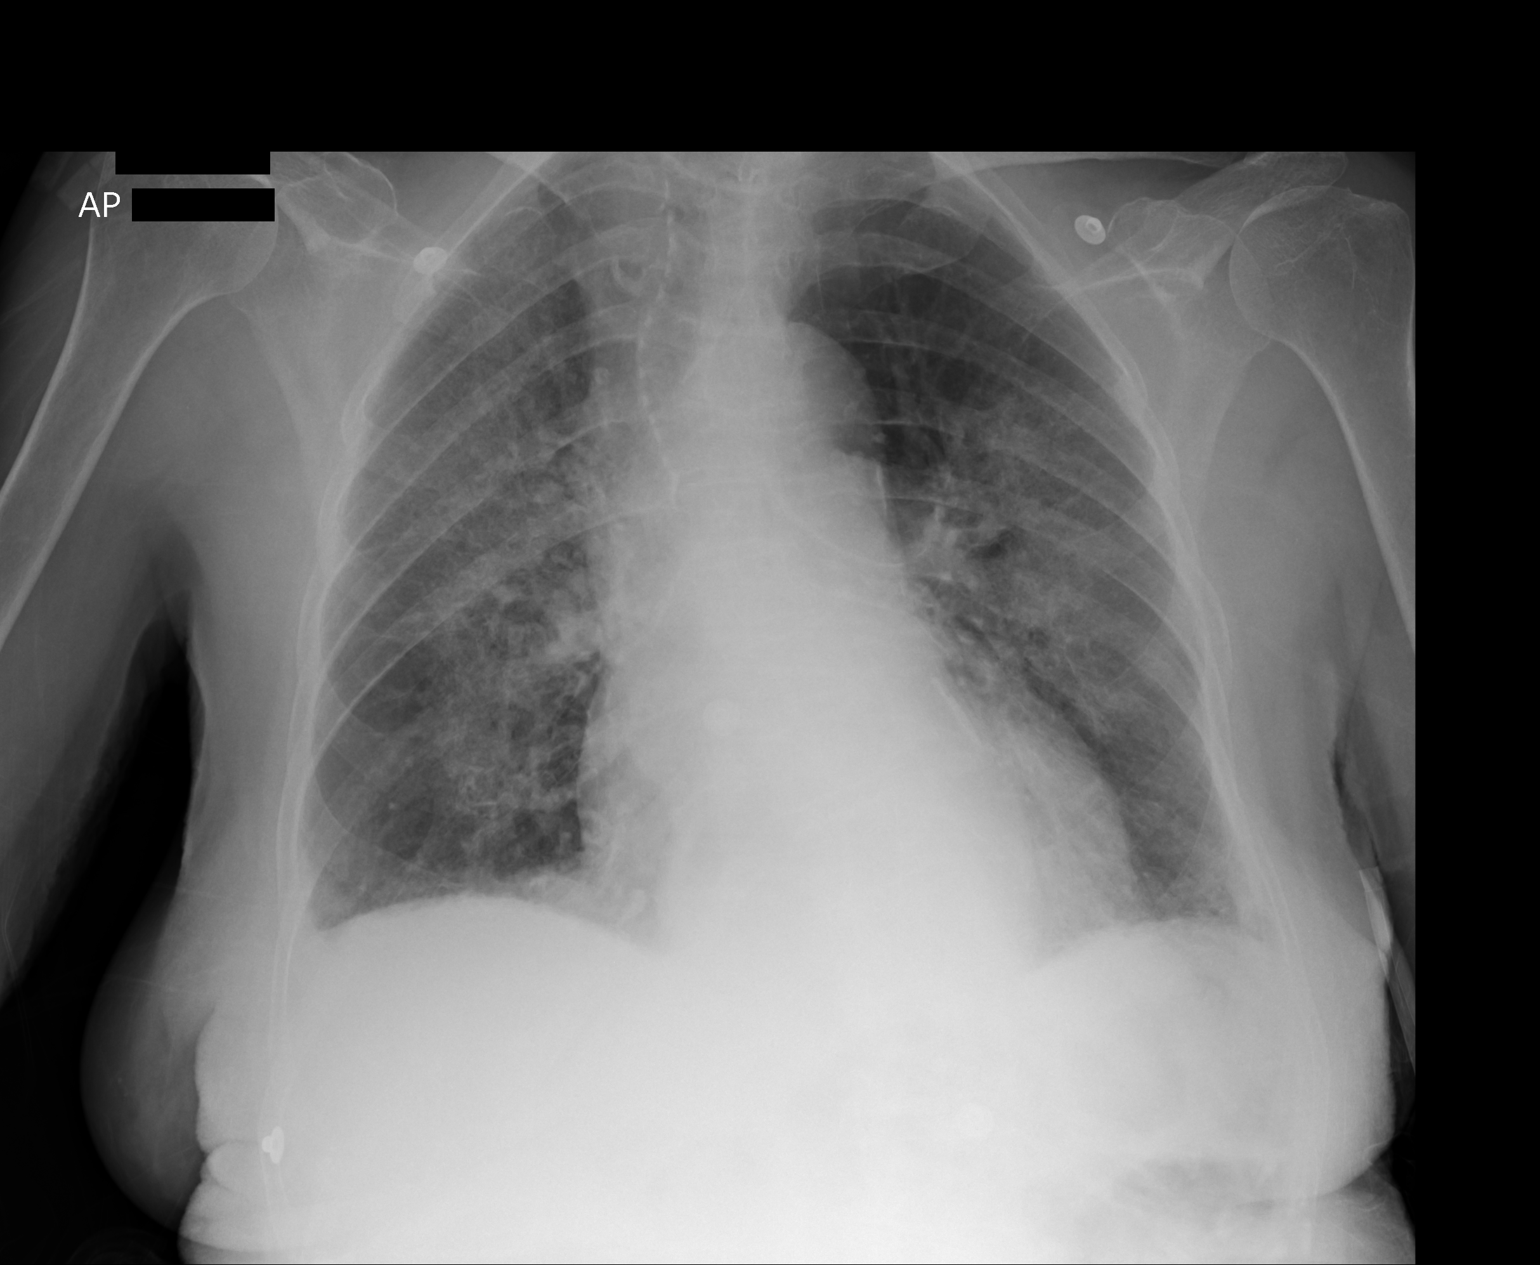
[im 2/2]
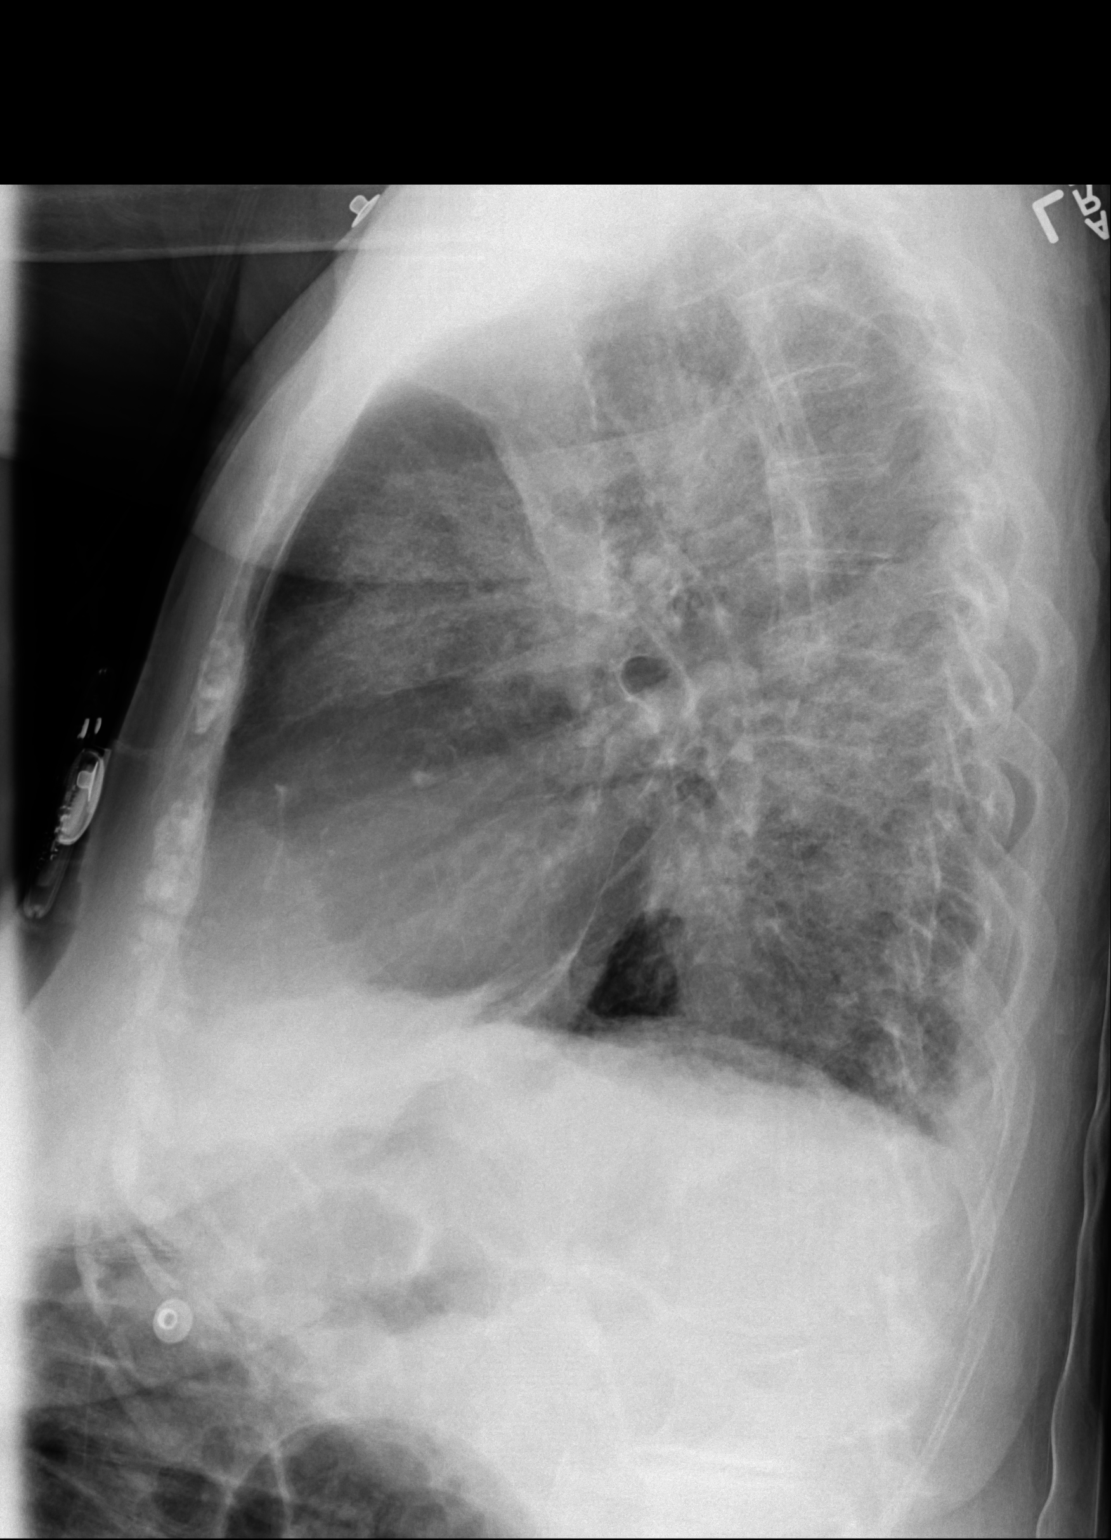

[2 of 2 positions shown; findings below may reference images not displayed]

FINDINGS: Upper normal heart size.

Calcified tortuous aorta.

Moderate-sized hiatal hernia.

Patchy perihilar infiltrates increased since previous exam.

Underlying COPD.

No gross pleural effusion or pneumothorax.

Bones demineralized.
IMPRESSION: Moderate-sized hiatal hernia.

Increased bilateral perihilar infiltrates consistent with pneumonia.

## 2015-10-11 IMAGING — CR DG CHEST 2V
1 series · 2 of 2 positions shown · non-contrast
Comparison: 01/26/2014

CLINICAL DATA: Pneumonia

EXAM:
CHEST  2 VIEW

[Series 1: x chest ap · 0.14mm/px · 2 of 2 slices shown]
[im 1/2]
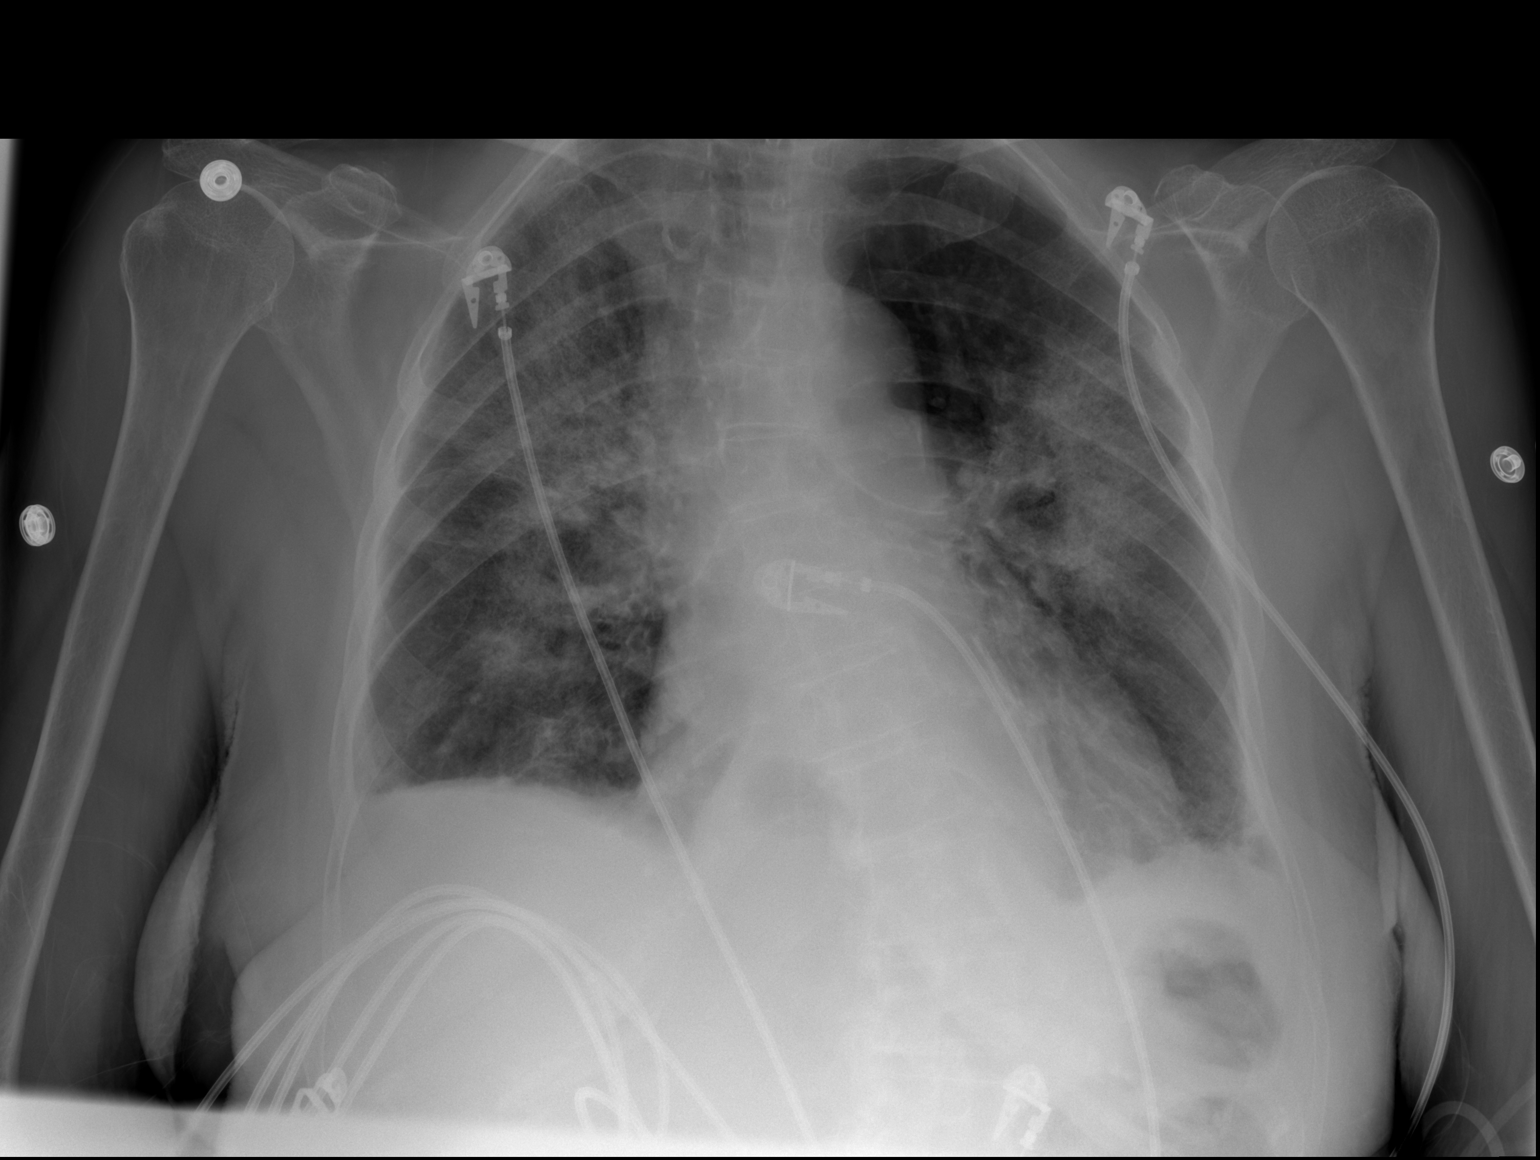
[im 2/2]
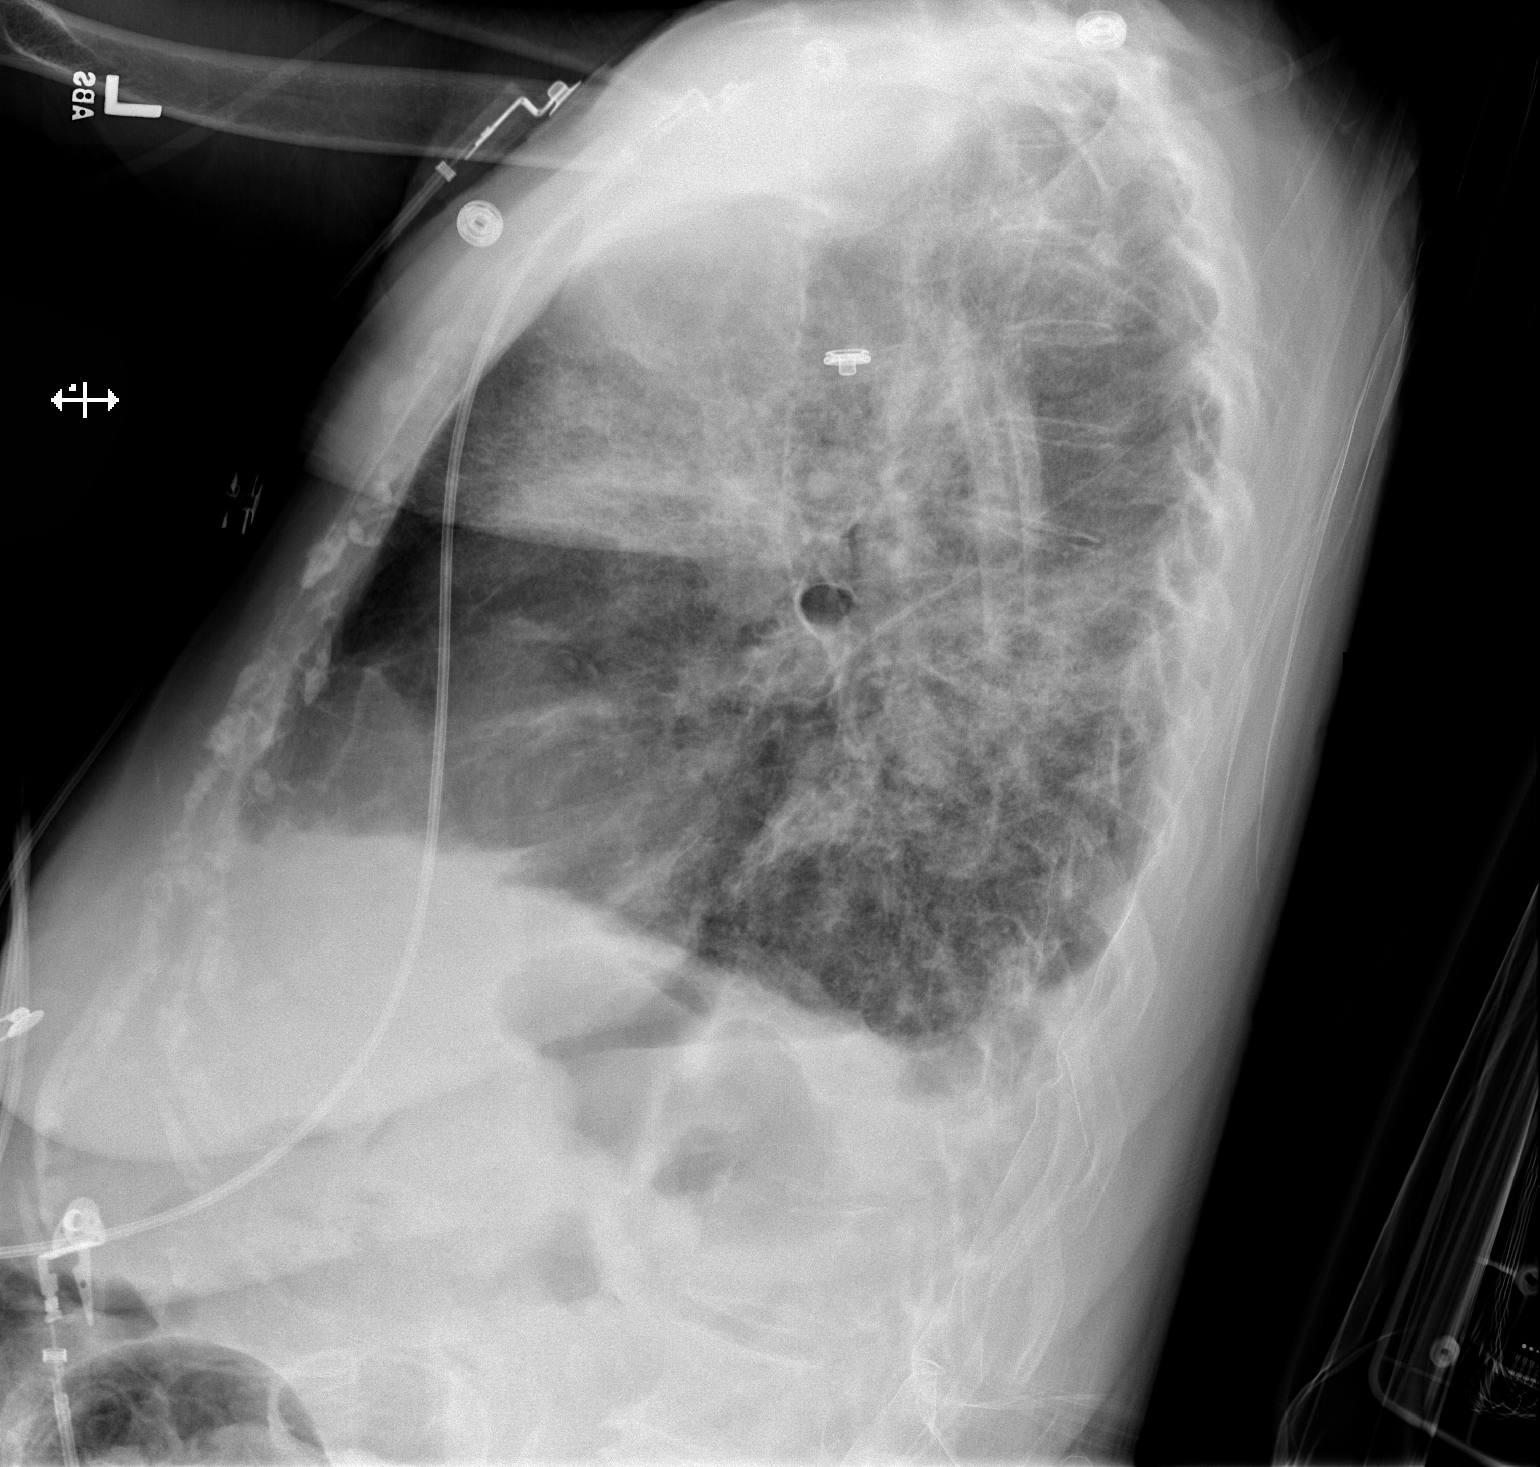

[2 of 2 positions shown; findings below may reference images not displayed]

FINDINGS: Upper normal heart size.

Hiatal hernia.

Atherosclerotic calcification aorta.

Bilateral airspace infiltrates most prominent in the upper lobe and
perihilar regions consistent with pneumonia.

Tiny bibasilar pleural effusions.

No pneumothorax.

Question underlying emphysematous changes.

Biconvex thoracolumbar scoliosis and osseous demineralization.
IMPRESSION: Bilateral pulmonary infiltrates consistent with pneumonia, slightly
increased since previous exam.
